# Patient Record
Sex: Male | Born: 1970 | Race: White | Hispanic: No | Marital: Single | Smoking: Former smoker
Health system: Southern US, Community
[De-identification: ages and names within clinical notes are randomized; demographics above are authoritative.]

## PROBLEM LIST (undated history)

## (undated) DIAGNOSIS — B009 Herpesviral infection, unspecified: Secondary | ICD-10-CM

## (undated) DIAGNOSIS — B159 Hepatitis A without hepatic coma: Secondary | ICD-10-CM

## (undated) DIAGNOSIS — E785 Hyperlipidemia, unspecified: Secondary | ICD-10-CM

## (undated) HISTORY — DX: Hepatitis a without hepatic coma: B15.9

## (undated) HISTORY — DX: Hyperlipidemia, unspecified: E78.5

## (undated) HISTORY — DX: Herpesviral infection, unspecified: B00.9

---

## 1997-07-09 HISTORY — PX: COCCYX REMOVAL: SHX600

## 2002-07-09 HISTORY — PX: BACK SURGERY: SHX140

## 2003-06-14 ENCOUNTER — Ambulatory Visit (HOSPITAL_COMMUNITY): Admission: RE | Admit: 2003-06-14 | Discharge: 2003-06-15 | Payer: Self-pay | Admitting: Specialist

## 2011-10-01 ENCOUNTER — Other Ambulatory Visit: Payer: Self-pay | Admitting: Family Medicine

## 2011-10-01 ENCOUNTER — Ambulatory Visit
Admission: RE | Admit: 2011-10-01 | Discharge: 2011-10-01 | Disposition: A | Discharge status: Home / Self Care | Payer: Managed Care, Other (non HMO) | Source: Ambulatory Visit | Attending: Family Medicine | Admitting: Family Medicine

## 2011-10-01 DIAGNOSIS — R109 Unspecified abdominal pain: Secondary | ICD-10-CM

## 2011-10-03 ENCOUNTER — Encounter (INDEPENDENT_AMBULATORY_CARE_PROVIDER_SITE_OTHER): Payer: Self-pay | Admitting: Surgery

## 2011-10-03 ENCOUNTER — Ambulatory Visit (INDEPENDENT_AMBULATORY_CARE_PROVIDER_SITE_OTHER): Payer: Managed Care, Other (non HMO) | Admitting: Surgery

## 2011-10-03 ENCOUNTER — Telehealth (INDEPENDENT_AMBULATORY_CARE_PROVIDER_SITE_OTHER): Payer: Self-pay | Admitting: Surgery

## 2011-10-03 ENCOUNTER — Encounter (HOSPITAL_COMMUNITY): Payer: Self-pay | Admitting: *Deleted

## 2011-10-03 VITALS — BP 118/66 | HR 68 | Temp 97.4°F | Resp 16 | Ht 72.0 in | Wt 192.2 lb

## 2011-10-03 DIAGNOSIS — R1011 Right upper quadrant pain: Secondary | ICD-10-CM

## 2011-10-03 DIAGNOSIS — K8010 Calculus of gallbladder with chronic cholecystitis without obstruction: Secondary | ICD-10-CM

## 2011-10-03 NOTE — Patient Instructions (Signed)
Gallbladder Disease  Gallbladder disease (cholecystitis) is an inflammation of your gallbladder. It is usually caused by a build-up of stones (gallstones) or sludge (cholelithiasis) in your gallbladder. The gallbladder is not an essential organ. It is located slightly to the right of center in the belly (abdomen), behind the liver. It stores bile made in the liver. Bile aids in digestion of fats. Gallbladder disease may result in nausea (feeling sick to your stomach), abdominal pain, and jaundice. In severe cases, emergency surgery may be required.  The most common type of gallbladder disease is gallstones. They begin as small crystals and slowly grow into stones. Gallstone pain occurs when the bile duct has spasms. The spasms are caused by the stone passing out of the duct. The stone is trying to pass at the same time bile is passing into the small bowel for digestion. The pain usually begins suddenly. It may persist from several minutes to several hours. Infection can occur. Infection can add to discomfort and severity of an acute attack. The pain may be made worse by breathing deeply or by being jarred. There may be fever and tenderness to the touch. In some cases, when gallstones do not move into the bile duct, people have no pain or symptoms. These are called "silent" gallstones.  Women are three times more likely to develop gallstones than men. Women who have had several pregnancies are more likely to have gallbladder disease. Physicians sometimes advise removing diseased gallbladders before future pregnancies. Other factors that increase the risk of gallbladder disease are obesity, diets heavy in fried foods and dairy products, increasing age, prolonged use of medications containing male hormones, and heredity.  HOME CARE INSTRUCTIONS    If your physician prescribed an antibiotic, take as directed.   Only take over-the-counter or prescription medicines for pain, discomfort, or fever as directed by your  caregiver.   Follow a low fat diet until seen again. (Fat causes the gallbladder to contract.)   Follow-up as instructed. Attacks are almost always recurrent and surgery is usually required for permanent treatment.  SEEK IMMEDIATE MEDICAL CARE IF:    Pain is increasing and not controlled by medications.   The pain moves to another part of your abdomen or to your back. (Right sided pain can be appendicitis and left sided pain in adults can be diverticulitis).   You have a fever.   You develop nausea and vomiting.  Document Released: 06/25/2005 Document Revised: 06/14/2011 Document Reviewed: 05/11/2011  Exit[employer] Patient Information 2012 [employer].

## 2011-10-03 NOTE — Progress Notes (Signed)
Chief Complaint  Patient presents with  . Abdominal Pain    Cholelithiasis, cholecystitis - referral from Dr. Darrow Bussing, [facility]    HISTORY: Patient is a 41 year old white male from [place], [place].  He presents with a two-year history of intermittent right upper quadrant abdominal pain. His most recent episode began 2 days ago when he had sudden onset of right upper quadrant abdominal pain. This has improved but is persistent. He was seen at the office of his primary care physician. Laboratory work from her earlier this month was within normal limits. Ultrasound of the abdomen was performed on October 01, 2011 and shows cholelithiasis with evidence of cholecystitis.  The patient denies fevers or chills. He denies nausea or vomiting. He denies jaundice or acholic stools. The pain radiates to the back. He has had no prior abdominal surgery.  Past Medical History  Diagnosis Date  . Hyperlipemia   . HSV (herpes simplex virus) infection   . Abdominal pain      Current Outpatient Prescriptions  Medication Sig Dispense Refill  . fenofibrate (TRICOR) 145 MG tablet Take 145 mg by mouth daily.      . niacin (NIASPAN) 500 MG CR tablet Take 500 mg by mouth at bedtime.      . Probiotic Product (RESTORA PO) Take by mouth.      . valACYclovir (VALTREX) 1000 MG tablet Take 1 g by mouth as needed.          No Known Allergies   Family History  Problem Relation Age of Onset  . Hyperlipidemia Mother   . Hypertension Father      History   Social History  . Marital Status: Single    Spouse Name: N/A    Number of Children: N/A  . Years of Education: N/A   Social History Main Topics  . Smoking status: Current Everyday Smoker -- 0.3 packs/day    Types: Cigarettes  . Smokeless tobacco: Never Used  . Alcohol Use: Yes     socially - 2 drinks per week  . Drug Use: No  . Sexually Active: None   Other Topics Concern  . None   Social History Narrative  . None     REVIEW  OF SYSTEMS - PERTINENT POSITIVES ONLY: Denies jaundice. Denies acholic stools. Denies fever. Denies chills. History of hepatitis A as a teenager.  EXAM: Filed Vitals:   10/03/11 0856  BP: 118/66  Pulse: 68  Temp: 97.4 F (36.3 C)  Resp: 16    HEENT: normocephalic; pupils equal and reactive; sclerae clear; dentition good; mucous membranes moist NECK:  symmetric on extension; no palpable anterior or posterior cervical lymphadenopathy; no supraclavicular masses; no tenderness CHEST: clear to auscultation bilaterally without rales, rhonchi, or wheezes CARDIAC: regular rate and rhythm without significant murmur; peripheral pulses are full ABDOMEN: soft without distension; bowel sounds present; no mass; no hepatosplenomegaly; no hernia;  Mild tenderness right upper quadrant without guarding or rebound. Suggestion of a soft mass possibly representing the fundus of the gallbladder EXT:  non-tender without edema; no deformity NEURO: no gross focal deficits; no sign of tremor   LABORATORY RESULTS: See [facility] (CHL-Epic) for most recent results   RADIOLOGY RESULTS: See [facility] (CHL-Epic) for most recent results   IMPRESSION: Symptomatic cholelithiasis, probable chronic cholecystitis  PLAN: I discussed the above findings at length with the patient. I provided him with written literature to review. I have recommended laparoscopic cholecystectomy with intraoperative cholangiography. We have discussed the possibility  of conversion to open surgery. We discussed the possibility of choledocholithiasis and need for ERCP. Patient understands and wishes to proceed with surgery in the near future. We have discussed Hospital course to be anticipated and his postoperative recovery and return to work. He understands.  The risks and benefits of the procedure have been discussed at length with the patient.  The patient understands the proposed procedure, potential alternative treatments, and  the course of recovery to be expected.  All of the patient's questions have been answered at this time.  The patient wishes to proceed with surgery.   Velora Heckler, MD, FACS General & Endocrine Surgery [facility]   Visit Diagnoses: 1. Cholelithiasis with cholecystitis   2. Abdominal pain, right upper quadrant     Primary Care Physician: Darrow Bussing, MD, MD

## 2011-10-09 ENCOUNTER — Encounter (HOSPITAL_COMMUNITY): Payer: Self-pay | Admitting: Pharmacy Technician

## 2011-10-18 ENCOUNTER — Encounter (HOSPITAL_COMMUNITY): Admission: RE | Payer: Self-pay | Source: Ambulatory Visit

## 2011-10-18 ENCOUNTER — Ambulatory Visit (HOSPITAL_COMMUNITY): Admission: RE | Admit: 2011-10-18 | Payer: Managed Care, Other (non HMO) | Source: Ambulatory Visit | Admitting: Surgery

## 2011-10-18 SURGERY — LAPAROSCOPIC CHOLECYSTECTOMY WITH INTRAOPERATIVE CHOLANGIOGRAM
Anesthesia: General

## 2011-10-29 ENCOUNTER — Encounter (INDEPENDENT_AMBULATORY_CARE_PROVIDER_SITE_OTHER): Payer: Managed Care, Other (non HMO) | Admitting: Surgery

## 2013-03-10 ENCOUNTER — Encounter (INDEPENDENT_AMBULATORY_CARE_PROVIDER_SITE_OTHER): Payer: Self-pay | Admitting: Surgery

## 2013-03-10 ENCOUNTER — Encounter (INDEPENDENT_AMBULATORY_CARE_PROVIDER_SITE_OTHER): Payer: Self-pay

## 2013-03-10 ENCOUNTER — Ambulatory Visit (INDEPENDENT_AMBULATORY_CARE_PROVIDER_SITE_OTHER): Payer: 59 | Admitting: Surgery

## 2013-03-10 ENCOUNTER — Telehealth (INDEPENDENT_AMBULATORY_CARE_PROVIDER_SITE_OTHER): Payer: Self-pay

## 2013-03-10 VITALS — BP 116/76 | HR 77 | Temp 97.7°F | Resp 16 | Ht 72.0 in | Wt 194.8 lb

## 2013-03-10 DIAGNOSIS — K8010 Calculus of gallbladder with chronic cholecystitis without obstruction: Secondary | ICD-10-CM

## 2013-03-10 DIAGNOSIS — R1011 Right upper quadrant pain: Secondary | ICD-10-CM

## 2013-03-10 NOTE — Telephone Encounter (Signed)
Letter printed and mailed to pts address per request.

## 2013-03-10 NOTE — Patient Instructions (Signed)
  [facility]  LAPAROSCOPIC SURGERY - POST-OP INSTRUCTIONS  Always review your discharge instruction sheet given to you by the facility where your surgery was performed.  A prescription for pain medication may be given to you upon discharge.  Take your pain medication as prescribed.  If narcotic pain medicine is not needed, then you may take acetaminophen (Tylenol) or ibuprofen (Advil) as needed.  Take your usually prescribed medications unless otherwise directed.  If you need a refill on your pain medication, please contact your pharmacy.  They will contact our office to request authorization. Prescriptions will not be filled after 5 P.M. or on weekends.  You should follow a light diet the first few days after arrival home, such as soup and crackers or toast.  Be sure to include plenty of fluids daily.  Most patients will experience some swelling and bruising in the area of the incisions.  Ice packs will help.  Swelling and bruising can take several days to resolve.   It is common to experience some constipation if taking pain medication after surgery.  Increasing fluid intake and taking a stool softener (such as Colace) will usually help or prevent this problem from occurring.  A mild laxative (Milk of Magnesia or Miralax) should be taken according to package instructions if there are no bowel movements after 48 hours.  Unless discharge instructions indicate otherwise, you may remove your bandages 24-48 hours after surgery, and you may shower at that time.  You may have steri-strips (small skin tapes) in place directly over the incision.  These strips should be left on the skin for 7-10 days.  If your surgeon used skin glue on the incision, you may shower in 24 hours.  The glue will flake off over the next 2-3 weeks.  Any sutures or staples will be removed at the office during your follow-up visit.  ACTIVITIES:  You may resume regular (light) daily activities beginning the  next day-such as daily self-care, walking, climbing stairs-gradually increasing activities as tolerated.  You may have sexual intercourse when it is comfortable.  Refrain from any heavy lifting or straining until approved by your doctor.  You may drive when you are no longer taking prescription pain medication, you can comfortably wear a seatbelt, and you can safely maneuver your car and apply brakes.  You should see your doctor in the office for a follow-up appointment approximately 2-3 weeks after your surgery.  Make sure that you call for this appointment within a day or two after you arrive home to insure a convenient appointment time.  WHEN TO CALL YOUR DOCTOR: 1. Fever over 101.0 2. Inability to urinate 3. Continued bleeding from incision 4. Increased pain, redness, or drainage from the incision 5. Increasing abdominal pain  The clinic staff is available to answer your questions during regular business hours.  Please don't hesitate to call and ask to speak to one of the nurses for clinical concerns.  If you have a medical emergency, go to the nearest emergency room or call 911.  A surgeon from [facility] is always on call for the hospital.  Luella Gardenhire M. Fran Neiswonger, MD, FACS [facility] Office: [phone] Toll Free:  [phone] FAX [phone]  Web site: [email or web address] 

## 2013-03-10 NOTE — Telephone Encounter (Signed)
Message copied by Joanette Gula on Tue Mar 10, 2013  1:58 PM ------      Message from: Leanne Chang      Created: Tue Mar 10, 2013 10:21 AM      Regarding: Dr Joanie Coddington: [phone]       Need letter stating he's having sx sent to his girlfriend in [place] so she will be allowed to come take care of him. May be mailed to patient. ------

## 2013-03-10 NOTE — Progress Notes (Signed)
General Surgery [facility]  Chief Complaint  Patient presents with  . Follow-up    cholelithiasis - symptomatic - primary care is Dr. Carilyn Goodpasture Koirala    HISTORY: Patient is a 42 year old male evaluated in March of 2013 with symptomatic cholelithiasis. Patient has postponed surgery and has tried homeopathic remedies with limited success. He had an episode of severe epigastric abdominal pain associated with nausea and vomiting lasting for approximately one day which occurred one month ago. He now presents to consider scheduling surgery for cholecystectomy.  PERTINENT REVIEW OF SYSTEMS: Denies jaundice. Denies acholic stools. Denies fever.  EXAM: HEENT: normocephalic; pupils equal and reactive; sclerae clear; dentition good; mucous membranes moist NECK:  symmetric on extension; no palpable anterior or posterior cervical lymphadenopathy; no supraclavicular masses; no tenderness CHEST: clear to auscultation bilaterally without rales, rhonchi, or wheezes CARDIAC: regular rate and rhythm without significant murmur; peripheral pulses are full ABDOMEN: Soft without distention; no incisions; no hernia; no palpable masses EXT:  non-tender without edema; no deformity NEURO: no gross focal deficits; no sign of tremor   IMPRESSION: Symptomatic cholelithiasis  PLAN: I again reviewed with the patient the procedure of laparoscopic cholecystectomy. We discussed alternative therapies. We discussed possible complications. He understands and wishes to proceed with laparoscopic cholecystectomy with intraoperative cholangiography in the near future.  The risks and benefits of the procedure have been discussed at length with the patient.  The patient understands the proposed procedure, potential alternative treatments, and the course of recovery to be expected.  All of the patient's questions have been answered at this time.  The patient wishes to proceed with surgery.  Velora Heckler,  MD, [facility] Office: [phone]  Visit Diagnoses: 1. Cholelithiasis with cholecystitis   2. Abdominal pain, right upper quadrant

## 2013-03-27 ENCOUNTER — Encounter (INDEPENDENT_AMBULATORY_CARE_PROVIDER_SITE_OTHER): Payer: Self-pay | Admitting: *Deleted

## 2013-04-01 ENCOUNTER — Telehealth (INDEPENDENT_AMBULATORY_CARE_PROVIDER_SITE_OTHER): Payer: Self-pay

## 2013-04-01 ENCOUNTER — Encounter (INDEPENDENT_AMBULATORY_CARE_PROVIDER_SITE_OTHER): Payer: Self-pay

## 2013-04-01 ENCOUNTER — Encounter (INDEPENDENT_AMBULATORY_CARE_PROVIDER_SITE_OTHER): Payer: Self-pay | Admitting: General Surgery

## 2013-04-01 ENCOUNTER — Telehealth (INDEPENDENT_AMBULATORY_CARE_PROVIDER_SITE_OTHER): Payer: Self-pay | Admitting: Surgery

## 2013-04-01 NOTE — Telephone Encounter (Signed)
Patient date of surgery has changed he will need a new  letter stating he's having sx sent to his girlfriend in [place] so she will be allowed to come take care of him. May be mailed to patient.

## 2013-04-01 NOTE — Telephone Encounter (Signed)
Pt advised of recovery expectations. Pt states he understands. Letter for caregivers visa faxed to [phone] and mailed to pt at home address.

## 2013-04-28 ENCOUNTER — Encounter (HOSPITAL_COMMUNITY): Payer: Self-pay | Admitting: Pharmacy Technician

## 2013-04-30 ENCOUNTER — Other Ambulatory Visit (HOSPITAL_COMMUNITY): Payer: Self-pay | Admitting: *Deleted

## 2013-04-30 NOTE — Patient Instructions (Addendum)
20      Your procedure is scheduled on:  Tuesday 05/05/2013  Report to [facility] at  0530 AM.  Call this number if you have problems the night before or morning of surgery: [phone]   Remember:   Do not eat food or drink liquids AFTER MIDNIGHT!   Do not bring valuables to the hospital. [facility] IS NOT RESPONSIBLE  FOR ANY BELONGINGS OR VALUABLES BROUGHT TO HOSPITAL.  Leave suitcase in the car. After surgery it may be brought to your room.  For patients admitted to the hospital, checkout time is 11:00 AM the day of              Discharge.    DO NOT WEAR JEWELRY , MAKE-UP, LOTIONS,POWDERS,PERFUMES!             WOMEN -DO NOT SHAVE LEGS OR UNDERARMS 12 HRS. BEFORE  SURGERY!               MEN MAY SHAVE AS USUAL!             CONTACTS,DENTURES OR BRIDGEWORK, FALSE EYELASHES MAY  NOT BE WORN INTO SURGERY!                                 Special Instructions:             Please read over the following fact sheets that you were given:             1. [facility] PREPARING FOR SURGERY SHEET                              Steve Francis     [phone]                FAILURE TO FOLLOW THESE INSTRUCTIONS MAY RESULT IN   CANCELLATION OF YOUR SURGERY!               Patient Signature:___________________________

## 2013-05-01 ENCOUNTER — Encounter (HOSPITAL_COMMUNITY): Payer: Self-pay

## 2013-05-01 ENCOUNTER — Encounter (INDEPENDENT_AMBULATORY_CARE_PROVIDER_SITE_OTHER): Payer: Self-pay

## 2013-05-01 ENCOUNTER — Encounter (HOSPITAL_COMMUNITY)
Admission: RE | Admit: 2013-05-01 | Discharge: 2013-05-01 | Disposition: A | Discharge status: Home / Self Care | Payer: 59 | Source: Ambulatory Visit | Attending: Surgery | Admitting: Surgery

## 2013-05-01 DIAGNOSIS — Z01812 Encounter for preprocedural laboratory examination: Secondary | ICD-10-CM | POA: Insufficient documentation

## 2013-05-01 DIAGNOSIS — Z01818 Encounter for other preprocedural examination: Secondary | ICD-10-CM | POA: Insufficient documentation

## 2013-05-01 LAB — COMPREHENSIVE METABOLIC PANEL
ALT: 21 U/L (ref 0–53)
AST: 25 U/L (ref 0–37)
Albumin: 4.1 g/dL (ref 3.5–5.2)
Alkaline Phosphatase: 52 U/L (ref 39–117)
BUN: 17 mg/dL (ref 6–23)
CO2: 25 mEq/L (ref 19–32)
Calcium: 9.6 mg/dL (ref 8.4–10.5)
Chloride: 102 mEq/L (ref 96–112)
Creatinine, Ser: 0.88 mg/dL (ref 0.50–1.35)
GFR calc Af Amer: >90 mL/min (ref >90)
GFR calc non Af Amer: >90 mL/min (ref >90)
Glucose, Bld: 95 mg/dL (ref 70–99)
Potassium: 4.7 mEq/L (ref 3.5–5.1)
Sodium: 137 mEq/L (ref 135–145)
Total Bilirubin: 0.4 mg/dL (ref 0.3–1.2)
Total Protein: 7.4 g/dL (ref 6.0–8.3)

## 2013-05-01 LAB — CBC
HCT: 45.7 % (ref 39.0–52.0)
Hemoglobin: 15.3 g/dL (ref 13.0–17.0)
MCH: 29.1 pg (ref 26.0–34.0)
MCHC: 33.5 g/dL (ref 30.0–36.0)
MCV: 86.9 fL (ref 78.0–100.0)
Platelets: 190 10*3/uL (ref 150–400)
RBC: 5.26 MIL/uL (ref 4.22–5.81)
RDW: 13.9 % (ref 11.5–15.5)
WBC: 7.9 10*3/uL (ref 4.0–10.5)

## 2013-05-01 NOTE — Progress Notes (Signed)
Quick Note:  These results are acceptable for scheduled surgery.  Rashun Grattan M. Garon Melander, MD, FACS [facility] Office: [phone]   ______ 

## 2013-05-01 NOTE — Progress Notes (Signed)
Quick Note:  These results are acceptable for scheduled surgery.  Emme Rosenau M. Danetra Glock, MD, FACS [facility] Office: [phone]   ______ 

## 2013-05-01 NOTE — Progress Notes (Signed)
Quick Note:  These results are acceptable for scheduled surgery.  Madden Piazza M. Nuriya Stuck, MD, FACS [facility] Office: [phone]   ______ 

## 2013-05-05 ENCOUNTER — Telehealth (INDEPENDENT_AMBULATORY_CARE_PROVIDER_SITE_OTHER): Payer: Self-pay | Admitting: *Deleted

## 2013-05-05 ENCOUNTER — Encounter (HOSPITAL_COMMUNITY)
Admission: RE | Disposition: A | Discharge status: Home / Self Care | Payer: Self-pay | Source: Ambulatory Visit | Attending: Surgery

## 2013-05-05 ENCOUNTER — Observation Stay (HOSPITAL_COMMUNITY)
Admission: RE | Admit: 2013-05-05 | Discharge: 2013-05-05 | Disposition: A | Discharge status: Home / Self Care | Payer: 59 | Source: Ambulatory Visit | Attending: Surgery | Admitting: Surgery

## 2013-05-05 ENCOUNTER — Encounter (HOSPITAL_COMMUNITY): Payer: 59 | Admitting: Anesthesiology

## 2013-05-05 ENCOUNTER — Ambulatory Visit (HOSPITAL_COMMUNITY): Payer: 59

## 2013-05-05 ENCOUNTER — Encounter (HOSPITAL_COMMUNITY): Payer: Self-pay | Admitting: *Deleted

## 2013-05-05 ENCOUNTER — Ambulatory Visit (HOSPITAL_COMMUNITY): Payer: 59 | Admitting: Anesthesiology

## 2013-05-05 DIAGNOSIS — K8010 Calculus of gallbladder with chronic cholecystitis without obstruction: Principal | ICD-10-CM | POA: Insufficient documentation

## 2013-05-05 DIAGNOSIS — R1011 Right upper quadrant pain: Secondary | ICD-10-CM | POA: Diagnosis present

## 2013-05-05 HISTORY — PX: CHOLECYSTECTOMY: SHX55

## 2013-05-05 SURGERY — LAPAROSCOPIC CHOLECYSTECTOMY WITH INTRAOPERATIVE CHOLANGIOGRAM
Anesthesia: General | Wound class: Clean Contaminated

## 2013-05-05 MED ORDER — ONDANSETRON HCL 4 MG PO TABS: 4.0000 mg | ORAL_TABLET | Freq: Four times a day (QID) | ORAL | Status: DC | PRN

## 2013-05-05 MED ORDER — CEFAZOLIN SODIUM-DEXTROSE 2-3 GM-% IV SOLR
2.0000 g | INTRAVENOUS | Status: AC
  Administered 2013-05-05: 2 g via INTRAVENOUS

## 2013-05-05 MED ORDER — MEPERIDINE HCL 50 MG/ML IJ SOLN
6.2500 mg | INTRAMUSCULAR | Status: DC | PRN
  Administered 2013-05-05: 6.25 mg via INTRAVENOUS

## 2013-05-05 MED ORDER — GLYCOPYRROLATE 0.2 MG/ML IJ SOLN
INTRAMUSCULAR | Status: DC | PRN
  Administered 2013-05-05: 0.6 mg via INTRAVENOUS

## 2013-05-05 MED ORDER — BUPIVACAINE-EPINEPHRINE 0.5% -1:200000 IJ SOLN
INTRAMUSCULAR | Status: AC
  Filled 2013-05-05: qty 1

## 2013-05-05 MED ORDER — KCL IN DEXTROSE-NACL 20-5-0.45 MEQ/L-%-% IV SOLN
INTRAVENOUS | Status: DC
  Administered 2013-05-05: 12:00:00 via INTRAVENOUS
  Filled 2013-05-05: qty 1000

## 2013-05-05 MED ORDER — OXYCODONE-ACETAMINOPHEN 5-325 MG PO TABS
1.0000 | ORAL_TABLET | ORAL | Status: DC | PRN
  Administered 2013-05-05: 2 via ORAL
  Filled 2013-05-05: qty 2

## 2013-05-05 MED ORDER — DEXAMETHASONE SODIUM PHOSPHATE 10 MG/ML IJ SOLN
INTRAMUSCULAR | Status: DC | PRN
  Administered 2013-05-05: 10 mg via INTRAVENOUS

## 2013-05-05 MED ORDER — LACTATED RINGERS IV SOLN: INTRAVENOUS | Status: DC

## 2013-05-05 MED ORDER — ONDANSETRON HCL 4 MG/2ML IJ SOLN
INTRAMUSCULAR | Status: DC | PRN
  Administered 2013-05-05: 4 mg via INTRAVENOUS

## 2013-05-05 MED ORDER — OXYCODONE HCL ER 20 MG PO T12A: 20.0000 mg | EXTENDED_RELEASE_TABLET | Freq: Two times a day (BID) | ORAL | Status: DC

## 2013-05-05 MED ORDER — MIDAZOLAM HCL 5 MG/5ML IJ SOLN
INTRAMUSCULAR | Status: DC | PRN
  Administered 2013-05-05: 2 mg via INTRAVENOUS

## 2013-05-05 MED ORDER — ONDANSETRON HCL 4 MG/2ML IJ SOLN: 4.0000 mg | Freq: Four times a day (QID) | INTRAMUSCULAR | Status: DC | PRN

## 2013-05-05 MED ORDER — NEOSTIGMINE METHYLSULFATE 1 MG/ML IJ SOLN
INTRAMUSCULAR | Status: DC | PRN
  Administered 2013-05-05: 4 mg via INTRAVENOUS

## 2013-05-05 MED ORDER — OXYCODONE HCL ER 20 MG PO T12A
20.0000 mg | EXTENDED_RELEASE_TABLET | Freq: Two times a day (BID) | ORAL | Status: DC
  Filled 2013-05-05: qty 1

## 2013-05-05 MED ORDER — PROMETHAZINE HCL 25 MG/ML IJ SOLN: 6.2500 mg | INTRAMUSCULAR | Status: DC | PRN

## 2013-05-05 MED ORDER — PROPOFOL 10 MG/ML IV BOLUS
INTRAVENOUS | Status: DC | PRN
  Administered 2013-05-05: 180 mg via INTRAVENOUS

## 2013-05-05 MED ORDER — CEFAZOLIN SODIUM-DEXTROSE 2-3 GM-% IV SOLR
INTRAVENOUS | Status: AC
  Filled 2013-05-05: qty 50

## 2013-05-05 MED ORDER — HYDROMORPHONE HCL PF 1 MG/ML IJ SOLN
0.2500 mg | INTRAMUSCULAR | Status: DC | PRN
  Administered 2013-05-05: 0.5 mg via INTRAVENOUS

## 2013-05-05 MED ORDER — 0.9 % SODIUM CHLORIDE (POUR BTL) OPTIME
TOPICAL | Status: DC | PRN
  Administered 2013-05-05: 1000 mL

## 2013-05-05 MED ORDER — SUCCINYLCHOLINE CHLORIDE 20 MG/ML IJ SOLN
INTRAMUSCULAR | Status: DC | PRN
  Administered 2013-05-05: 100 mg via INTRAVENOUS

## 2013-05-05 MED ORDER — LACTATED RINGERS IV SOLN
INTRAVENOUS | Status: DC | PRN
  Administered 2013-05-05 (×2): via INTRAVENOUS

## 2013-05-05 MED ORDER — MEPERIDINE HCL 50 MG/ML IJ SOLN
INTRAMUSCULAR | Status: AC
  Filled 2013-05-05: qty 1

## 2013-05-05 MED ORDER — HYDROMORPHONE HCL PF 1 MG/ML IJ SOLN
INTRAMUSCULAR | Status: AC
  Filled 2013-05-05: qty 1

## 2013-05-05 MED ORDER — OXYCODONE-ACETAMINOPHEN 5-325 MG PO TABS: 1.0000 | ORAL_TABLET | ORAL | Status: DC | PRN

## 2013-05-05 MED ORDER — HYDROMORPHONE HCL PF 1 MG/ML IJ SOLN: 1.0000 mg | INTRAMUSCULAR | Status: DC | PRN

## 2013-05-05 MED ORDER — BUPIVACAINE-EPINEPHRINE 0.5% -1:200000 IJ SOLN
INTRAMUSCULAR | Status: DC | PRN
  Administered 2013-05-05: 20 mL

## 2013-05-05 MED ORDER — IOHEXOL 300 MG/ML  SOLN
INTRAMUSCULAR | Status: DC | PRN
  Administered 2013-05-05: 50 mL via INTRAVENOUS

## 2013-05-05 MED ORDER — ACETAMINOPHEN 325 MG PO TABS: 650.0000 mg | ORAL_TABLET | ORAL | Status: DC | PRN

## 2013-05-05 MED ORDER — ROCURONIUM BROMIDE 100 MG/10ML IV SOLN
INTRAVENOUS | Status: DC | PRN
  Administered 2013-05-05: 30 mg via INTRAVENOUS
  Administered 2013-05-05 (×2): 10 mg via INTRAVENOUS

## 2013-05-05 MED ORDER — FENTANYL CITRATE 0.05 MG/ML IJ SOLN
INTRAMUSCULAR | Status: DC | PRN
  Administered 2013-05-05 (×2): 50 ug via INTRAVENOUS
  Administered 2013-05-05: 100 ug via INTRAVENOUS
  Administered 2013-05-05 (×2): 50 ug via INTRAVENOUS

## 2013-05-05 MED ORDER — LACTATED RINGERS IV SOLN
INTRAVENOUS | Status: DC | PRN
  Administered 2013-05-05: 1000 mL via INTRAVENOUS

## 2013-05-05 MED ORDER — LIDOCAINE HCL (CARDIAC) 20 MG/ML IV SOLN
INTRAVENOUS | Status: DC | PRN
  Administered 2013-05-05: 60 mg via INTRAVENOUS

## 2013-05-05 SURGICAL SUPPLY — 36 items
APPLIER CLIP ROT 10 11.4 M/L (STAPLE) ×2
BENZOIN TINCTURE PRP APPL 2/3 (GAUZE/BANDAGES/DRESSINGS) ×2 IMPLANT
CABLE HIGH FREQUENCY MONO STRZ (ELECTRODE) ×2 IMPLANT
CANISTER SUCTION 2500CC (MISCELLANEOUS) ×2 IMPLANT
CHLORAPREP W/TINT 26ML (MISCELLANEOUS) ×2 IMPLANT
CLIP APPLIE ROT 10 11.4 M/L (STAPLE) ×1 IMPLANT
COVER MAYO STAND STRL (DRAPES) ×2 IMPLANT
DECANTER SPIKE VIAL GLASS SM (MISCELLANEOUS) ×2 IMPLANT
DRAPE C-ARM 42X120 X-RAY (DRAPES) ×2 IMPLANT
DRAPE LAPAROSCOPIC ABDOMINAL (DRAPES) ×2 IMPLANT
DRAPE UTILITY XL STRL (DRAPES) ×2 IMPLANT
ELECT REM PT RETURN 9FT ADLT (ELECTROSURGICAL) ×2
ELECTRODE REM PT RTRN 9FT ADLT (ELECTROSURGICAL) ×1 IMPLANT
GAUZE SPONGE 2X2 8PLY STRL LF (GAUZE/BANDAGES/DRESSINGS) ×1 IMPLANT
GLOVE SURG ORTHO 8.0 STRL STRW (GLOVE) ×2 IMPLANT
GOWN STRL REIN XL XLG (GOWN DISPOSABLE) ×6 IMPLANT
HEMOSTAT SURGICEL 4X8 (HEMOSTASIS) IMPLANT
KIT BASIN OR (CUSTOM PROCEDURE TRAY) ×2 IMPLANT
NS IRRIG 1000ML POUR BTL (IV SOLUTION) ×2 IMPLANT
POUCH SPECIMEN RETRIEVAL 10MM (ENDOMECHANICALS) ×2 IMPLANT
SCISSORS LAP 5X35 DISP (ENDOMECHANICALS) ×2 IMPLANT
SET CHOLANGIOGRAPH MIX (MISCELLANEOUS) ×2 IMPLANT
SET IRRIG TUBING LAPAROSCOPIC (IRRIGATION / IRRIGATOR) ×2 IMPLANT
SLEEVE XCEL OPT CAN 5 100 (ENDOMECHANICALS) ×2 IMPLANT
SOLUTION ANTI FOG 6CC (MISCELLANEOUS) ×2 IMPLANT
SPONGE GAUZE 2X2 STER 10/PKG (GAUZE/BANDAGES/DRESSINGS) ×1
STRIP CLOSURE SKIN 1/2X4 (GAUZE/BANDAGES/DRESSINGS) ×2 IMPLANT
SUT MNCRL AB 4-0 PS2 18 (SUTURE) ×2 IMPLANT
TAPE CLOTH SURG 4X10 WHT LF (GAUZE/BANDAGES/DRESSINGS) ×2 IMPLANT
TOWEL OR 17X26 10 PK STRL BLUE (TOWEL DISPOSABLE) ×2 IMPLANT
TOWEL OR NON WOVEN STRL DISP B (DISPOSABLE) ×2 IMPLANT
TRAY LAP CHOLE (CUSTOM PROCEDURE TRAY) ×2 IMPLANT
TROCAR BLADELESS OPT 5 100 (ENDOMECHANICALS) ×2 IMPLANT
TROCAR XCEL BLUNT TIP 100MML (ENDOMECHANICALS) ×2 IMPLANT
TROCAR XCEL NON-BLD 11X100MML (ENDOMECHANICALS) ×2 IMPLANT
TUBING INSUFFLATION 10FT LAP (TUBING) ×2 IMPLANT

## 2013-05-05 NOTE — Telephone Encounter (Signed)
Pt called back demanding someone from our office come see him because he is in pain. He states he will not leave the hospital until he speaks to someone. I asked him what exactly he wanted since he was given pain medicine and he said he wasn't given what Dr Gerrit Friends and him agreed upon. I asked if he addressed this issue with the nurses and staff at the hospital and he states they told him they could not do anything for him. I told patient I would page our PA that was at the hospital and see if I could get someone over to talk to him. Paged Tresa Endo and she said she would stop by his room and address this issue.

## 2013-05-05 NOTE — H&P (Signed)
Steve Francis is an 42 y.o. male.    General Surgery [facility]  Chief Complaint:  Abdominal pain, symptomatic cholelithiasis  HPI: Patient is a 42 year old male evaluated in March of 2013 with symptomatic cholelithiasis. Patient has postponed surgery and has tried homeopathic remedies with limited success. He had an episode of severe epigastric abdominal pain associated with nausea and vomiting lasting for approximately one day which occurred one month ago. He now presents for cholecystectomy.   Past Medical History  Diagnosis Date  . Hyperlipemia     "on track now"  . HSV (herpes simplex virus) infection   . Abdominal pain   . Hepatitis A age 73    childhood    Past Surgical History  Procedure Laterality Date  . Coccyx removal  1999    broke it so removed part of it  . Back surgery  2004    lower s 4    Family History  Problem Relation Age of Onset  . Hyperlipidemia Mother   . Hypertension Father    Social History:  reports that he quit smoking about 13 months ago. His smoking use included Cigarettes. He has a 7.2 pack-year smoking history. He has never used smokeless tobacco. He reports that he drinks alcohol. He reports that he does not use illicit drugs.  Allergies:  Allergies  Allergen Reactions  . Cinnamon Anaphylaxis    Throat swells    Medications Prior to Admission  Medication Sig Dispense Refill  . fenofibrate (TRICOR) 145 MG tablet Take 145 mg by mouth every morning.       . valACYclovir (VALTREX) 1000 MG tablet Take 1 g by mouth daily as needed. Outbreak        No results found for this or any previous visit (from the past 48 hour(s)). No results found.  Review of Systems  Gastrointestinal: Positive for nausea and abdominal pain.  All other systems reviewed and are negative.    There were no vitals taken for this visit. Physical Exam  Constitutional: He is oriented to person, place, and time. He appears well-developed and  well-nourished. No distress.  HENT:  Head: Normocephalic and atraumatic.  Right Ear: External ear normal.  Left Ear: External ear normal.  Eyes: Conjunctivae are normal. Pupils are equal, round, and reactive to light. No scleral icterus.  Neck: Normal range of motion. Neck supple. No thyromegaly present.  Cardiovascular: Normal rate, regular rhythm and normal heart sounds.   No murmur heard. Respiratory: Effort normal and breath sounds normal. He has no wheezes.  GI: Bowel sounds are normal. He exhibits no distension and no mass. There is no tenderness. There is no rebound and no guarding.  Musculoskeletal: Normal range of motion. He exhibits no edema.  Lymphadenopathy:    He has no cervical adenopathy.  Neurological: He is alert and oriented to person, place, and time.  Skin: Skin is warm and dry.  Psychiatric: He has a normal mood and affect. His behavior is normal.     Assessment/Plan Symptomatic cholelithiasis  Plan lap chole with IOC  The risks and benefits of the procedure have been discussed at length with the patient.  The patient understands the proposed procedure, potential alternative treatments, and the course of recovery to be expected.  All of the patient's questions have been answered at this time.  The patient wishes to proceed with surgery.  Velora Heckler, MD, [facility] Office: [phone]    Bridgette Wolden Judie Petit 05/05/2013,  7:41 AM

## 2013-05-05 NOTE — Progress Notes (Signed)
Patient c/o post op pain but refused dilaudid because it makes him sleepy.preferred to get 2 percocets instead, 45 min after taking percocet patient claims that percocet did not help his pain,Dr Gerrit Friends was paged and few minutes later MD came to round on patient,Md and patient discussed about pain med and RN also at bedside.MD prescribed oxycontin 20 mgs q 12 hrs ,percocet 1 to 2 tabs q 6 hrs prn.Explained to patient about pain med orders, patient got angry and claims that is not what he wants, -insisting on oxycodone 20 mgs IR because that was what he was getting when he had back sx  Years ago." I was taking a purple pill and i cut it into four to control my pain."Patient called MD office himself and demanded he gets the prescription he wants.Also wants to speak to the Pharmacist, Brayton Caves came to talk to him.Patient called MD office again, and office paged [facility] to come see patient.Tresa Endo came and talked to patient, patient finally agreed on the prescriptions, and got ready for discharge.Discharge instructions given and patient verbalized understanding of instructions given.Dressing on umbilical area has serosanguinous drainage about 2 in diam., dressing changed prior to discharge.Patient tolerated regular diet prior to d/c and voiding well, denies nausea.

## 2013-05-05 NOTE — Telephone Encounter (Signed)
Patient called with complaint that the prescription he is being given it not what he and Dr. Gerrit Friends discussed.  Patient states that the Oxycodone prescription was suppose to be in a pill that he can cut in half.  Explained to patient that if he feels he needs 10mg  of Oxycodone then just take 2 of the Percocet which would be 10mg  of the Oxycodone however patient states that Percocet does not work for him.  Tried to explain to patient that Percocet is Oxycodone it just has Tylenol added however patient states it doesn't work for him.  Explained that I will send a message to Dr. Gerrit Friends to make him aware and see what his opinion is however it will be tomorrow before I will have an answer.  Patient states understanding that I will send a message to Dr. Gerrit Friends to ask then we will let him know.

## 2013-05-05 NOTE — Anesthesia Preprocedure Evaluation (Signed)
Anesthesia Evaluation  Patient identified by MRN, date of birth, ID band Patient awake    Reviewed: Allergy & Precautions, H&P , NPO status , Patient's Chart, lab work & pertinent test results  Airway Mallampati: II TM Distance: >3 FB Neck ROM: Full    Dental  (+) Teeth Intact and Dental Advisory Given   Pulmonary neg pulmonary ROS, Current Smoker,  breath sounds clear to auscultation  Pulmonary exam normal       Cardiovascular negative cardio ROS  Rhythm:Regular Rate:Normal     Neuro/Psych negative neurological ROS  negative psych ROS   GI/Hepatic negative GI ROS, Neg liver ROS,   Endo/Other  negative endocrine ROS  Renal/GU negative Renal ROS  negative genitourinary   Musculoskeletal negative musculoskeletal ROS (+)   Abdominal   Peds  Hematology negative hematology ROS (+)   Anesthesia Other Findings   Reproductive/Obstetrics                           Anesthesia Physical Anesthesia Plan  ASA: I  Anesthesia Plan: General   Post-op Pain Management:    Induction: Intravenous  Airway Management Planned: Oral ETT  Additional Equipment:   Intra-op Plan:   Post-operative Plan: Extubation in OR  Informed Consent: I have reviewed the patients History and Physical, chart, labs and discussed the procedure including the risks, benefits and alternatives for the proposed anesthesia with the patient or authorized representative who has indicated his/her understanding and acceptance.   Dental advisory given  Plan Discussed with: CRNA  Anesthesia Plan Comments:         Anesthesia Quick Evaluation

## 2013-05-05 NOTE — Discharge Summary (Signed)
Physician Discharge Summary [facility]  Patient ID: Steve Francis MRN: [id number] DOB/AGE: [id number] y.o.  Admit date: 05/05/2013 Discharge date: 05/05/2013  Admission Diagnoses:  Chronic cholecystitis, cholelithiasis, abdominal pain  Discharge Diagnoses:  Principal Problem:   Cholelithiasis with cholecystitis Active Problems:   Abdominal pain, right upper quadrant   Discharged Condition: good  Hospital Course: patient admitted for observation after lap chole with IOC.  Post op was able to ambulate, voided, and tolerated diet.  Prepared for discharged and requested discharge home on day of surgery.  Consults: None  Significant Diagnostic Studies: cholangiography, intraop  Treatments: surgery: lap cholecystectomy with IOC  Discharge Exam: Blood pressure 129/71, pulse 71, temperature 97.9 F (36.6 C), resp. rate 16, height 6' (1.829 m), weight 194 lb (87.998 kg), SpO2 98.00%. HEENT - clear Neck - soft Chest - clear bilaterally Cor - RRR Abd - soft without distension; dressings dry and intact  Disposition: Home with family  Discharge Orders   Future Appointments Provider Department Dept Phone   05/20/2013 9:30 AM Velora Heckler, MD [facility], [place] [phone]   Future Orders Complete By Expires   Diet - low sodium heart healthy  As directed    Discharge instructions  As directed    Comments:     [facility]  LAPAROSCOPIC SURGERY - POST-OP INSTRUCTIONS  Always review your discharge instruction sheet given to you by the facility where your surgery was performed.  A prescription for pain medication may be given to you upon discharge.  Take your pain medication as prescribed.  If narcotic pain medicine is not needed, then you may take acetaminophen (Tylenol) or ibuprofen (Advil) as needed.  Take your usually prescribed medications unless otherwise directed.  If you need a refill on your pain medication,  please contact your pharmacy.  They will contact our office to request authorization. Prescriptions will not be filled after 5 P.M. or on weekends.  You should follow a light diet the first few days after arrival home, such as soup and crackers or toast.  Be sure to include plenty of fluids daily.  Most patients will experience some swelling and bruising in the area of the incisions.  Ice packs will help.  Swelling and bruising can take several days to resolve.   It is common to experience some constipation if taking pain medication after surgery.  Increasing fluid intake and taking a stool softener (such as Colace) will usually help or prevent this problem from occurring.  A mild laxative (Milk of Magnesia or Miralax) should be taken according to package instructions if there are no bowel movements after 48 hours.  Unless discharge instructions indicate otherwise, you may remove your bandages 24-48 hours after surgery, and you may shower at that time.  You may have steri-strips (small skin tapes) in place directly over the incision.  These strips should be left on the skin for 7-10 days.  If your surgeon used skin glue on the incision, you may shower in 24 hours.  The glue will flake off over the next 2-3 weeks.  Any sutures or staples will be removed at the office during your follow-up visit.  ACTIVITIES:  You may resume regular (light) daily activities beginning the next day-such as daily self-care, walking, climbing stairs-gradually increasing activities as tolerated.  You may have sexual intercourse when it is comfortable.  Refrain from any heavy lifting or straining until approved by your doctor.  You may drive when you  are no longer taking prescription pain medication, you can comfortably wear a seatbelt, and you can safely maneuver your car and apply brakes.  You should see your doctor in the office for a follow-up appointment approximately 2-3 weeks after your surgery.  Make sure that you call  for this appointment within a day or two after you arrive home to insure a convenient appointment time.  WHEN TO CALL YOUR DOCTOR: Fever over 101.0 Inability to urinate Continued bleeding from incision Increased pain, redness, or drainage from the incision Increasing abdominal pain  The clinic staff is available to answer your questions during regular business hours.  Please don't hesitate to call and ask to speak to one of the nurses for clinical concerns.  If you have a medical emergency, go to the nearest emergency room or call 911.  A surgeon from [facility] is always on call for the hospital.  Velora Heckler, MD, [facility] Office: [phone] Toll Free:  [phone] FAX [phone]  Web site: [email or web address]   Increase activity slowly  As directed    Remove dressing in 48 hours  As directed        Medication List         fenofibrate 145 MG tablet  Commonly known as:  TRICOR  Take 145 mg by mouth every morning.     OxyCODONE 20 mg T12a 12 hr tablet  Commonly known as:  OXYCONTIN  Take 1 tablet (20 mg total) by mouth every 12 (twelve) hours.     oxyCODONE-acetaminophen 5-325 MG per tablet  Commonly known as:  PERCOCET/ROXICET  Take 1-2 tablets by mouth every 4 (four) hours as needed.     valACYclovir 1000 MG tablet  Commonly known as:  VALTREX  Take 1 g by mouth daily as needed. Outbreak         Velora Heckler, MD, [facility] Office: [phone]   Signed: Velora Heckler 05/05/2013, 1:03 PM

## 2013-05-05 NOTE — Progress Notes (Signed)
Pt shivering, Dr. Rica Mast notified, order rec'd and med given

## 2013-05-05 NOTE — Anesthesia Postprocedure Evaluation (Signed)
Anesthesia Post Note  Patient: Steve Francis  Procedure(s) Performed: Procedure(s) (LRB): LAPAROSCOPIC CHOLECYSTECTOMY WITH INTRAOPERATIVE CHOLANGIOGRAM (N/A)  Anesthesia type: General  Patient location: PACU  Post pain: Pain level controlled  Post assessment: Post-op Vital signs reviewed  Last Vitals:  Filed Vitals:   05/05/13 1040  BP: 129/71  Pulse: 71  Temp: 36.6 C  Resp: 16    Post vital signs: Reviewed  Level of consciousness: sedated  Complications: No apparent anesthesia complications

## 2013-05-05 NOTE — Op Note (Signed)
Procedure Note  Pre-operative Diagnosis: Calculus of gallbladder with other cholecystitis, without mention of obstruction  Post-operative Diagnosis: Same  Surgeon:  Velora Heckler, MD, FACS  Assistant:  none   Procedure:  Laparoscopic cholecystectomy with intra-operative cholangiography  Anesthesia:  General  Estimated Blood Loss: Minimal  Drains: none         Specimen: Gallbladder to pathology  Indications:  This patient presents with symptomatic gallbladder disease and will undergo laparoscopic cholecystectomy with intraoperative cholangiography.  Procedure Details:  The patient was seen in the pre-op holding area. The risks, benefits, complications, treatment options, and expected outcomes have been discussed with the patient. The patient and/or family agreed with the proposed plan and signed the informed consent form.  The patient was taken to Operating Room, identified as Steve Francis and the procedure verified as Laparoscopic Cholecystectomy with Intraoperative Cholangiogram. A "time out" was completed and the above information confirmed.  Following induction of general anesthesia, the patient was placed in the supine position. The abdomen was prepped in the usual aseptic fashion.  An incision was made in the skin near the umbilicus. The midline fascia was incised and the peritoneal cavity entered and the Hasson canula was introduced under direct vision. Hasson canula was secured with a pursestring 0-Vicryl suture. Pneumoperitoneum was then established with carbon dioxide. Additional trocars were introduced under direct vision along the right costal margin in the midline, mid-clavicular line, and anterior axillary line.  The gallbladder was identified and the fundus grasped and retracted cephalad. Adhesions were taken down bluntly and with the electrocautery as needed, taking care not to injure any adjacent structures. The infundibulum was grasped and retracted laterally,  exposing the peritoneum overlying the triangle of Calot. This was incised and structures exposed in a blunt fashion. The cystic duct was clearly identified and bluntly dissected circumferentially and clipped at the neck of the gallbladder.  An incision was made in the cystic duct and the cholangiogram catheter introduced. The catheter was secured using an ligaclip.  Real-time cholangiography was performed using the C-arm.  There was rapid filling of a normal caliber common bile duct.  There was reflux of contrast into the left and right hepatic ductal systems.  There was free flow distally into the duodenum without filling defect or obstruction.  Catheter was removed from the peritoneal cavity.  The cystic duct was then doubly ligated with surgical clips and divided. The cystic artery was identified, dissected circumferentially, ligated with ligaclips, and divided.  The gallbladder was dissected from the liver bed with the electrocautery used for hemostasis. The gallbladder was completely removed and placed into an endocatch bag. The right upper quadrant was irrigated and inspected. Hemostasis was achieved with the electrocautery. Warm saline irrigation was utilized and was repeatedly aspirated until clear.  Pneumoperitoneum was released after viewing removal of the trocars with good hemostasis noted. The umbilical wound was irrigated and the fascia was then closed with the pursestring suture.  The skin was then closed with 4-0 Monocril subcuticular sutures and steri-strips and dressings were applied.  Instrument, sponge, and needle counts were correct at the conclusion of the case.  The patient tolerated the procedure well.                 Disposition: PACU - hemodynamically stable.   Velora Heckler, MD, [facility] Office: [phone]

## 2013-05-05 NOTE — Transfer of Care (Signed)
Immediate Anesthesia Transfer of Care Note  Patient: Steve Francis  Procedure(s) Performed: Procedure(s): LAPAROSCOPIC CHOLECYSTECTOMY WITH INTRAOPERATIVE CHOLANGIOGRAM (N/A)  Patient Location: PACU  Anesthesia Type:General  Level of Consciousness: sedated  Airway & Oxygen Therapy: Patient Spontanous Breathing and Patient connected to face mask oxygen  Post-op Assessment: Report given to PACU RN and Post -op Vital signs reviewed and stable  Post vital signs: Reviewed and stable  Complications: No apparent anesthesia complications

## 2013-05-05 NOTE — Care Management Note (Signed)
   CARE MANAGEMENT NOTE 05/05/2013  Patient:  Steve Francis, Steve Francis   Account Number:  [id number]  Date Initiated:  05/05/2013  Documentation initiated by:  Tattianna Schnarr  Subjective/Objective Assessment:   42 yo male admitted with ABD pain.     Action/Plan:   Home when stable   Anticipated DC Date:  05/05/2013   Anticipated DC Plan:  HOME/SELF CARE  In-house referral  NA      DC Planning Services  NA      PAC Choice  NA   Choice offered to / List presented to:  NA   DME arranged  NA      DME agency  NA     HH arranged  NA      HH agency  NA   Status of service:  Completed, signed off Medicare Important Message given?   (If response is "NO", the following Medicare IM given date fields will be blank) Date Medicare IM given:   Date Additional Medicare IM given:    Discharge Disposition:    Per UR Regulation:  Reviewed for med. necessity/level of care/duration of stay  If discussed at Long Length of Stay Meetings, dates discussed:    Comments:  05/05/13 1413 Nioka Thorington,RN,MSN [phone] Chart reviewed for utilization of services. No needs identified at this time.

## 2013-05-06 ENCOUNTER — Encounter (HOSPITAL_COMMUNITY): Payer: Self-pay | Admitting: Surgery

## 2013-05-06 NOTE — Telephone Encounter (Signed)
Pt doing well. States states he apologizes for his stress over pain control at time of phone call yesterday. He was basing his post op pain needs on previous back surgery. Pt states he is doing well and pain is controlled well. Voiding, eating and resting well.  Pt given po appt and will call with any concerns.

## 2013-05-13 ENCOUNTER — Encounter (INDEPENDENT_AMBULATORY_CARE_PROVIDER_SITE_OTHER): Payer: 59 | Admitting: Surgery

## 2013-05-18 ENCOUNTER — Encounter (INDEPENDENT_AMBULATORY_CARE_PROVIDER_SITE_OTHER): Payer: Self-pay

## 2013-05-18 ENCOUNTER — Encounter (INDEPENDENT_AMBULATORY_CARE_PROVIDER_SITE_OTHER): Payer: Self-pay | Admitting: Surgery

## 2013-05-18 ENCOUNTER — Ambulatory Visit (INDEPENDENT_AMBULATORY_CARE_PROVIDER_SITE_OTHER): Payer: 59 | Admitting: Surgery

## 2013-05-18 VITALS — BP 106/64 | HR 68 | Resp 16 | Ht 72.0 in | Wt 185.8 lb

## 2013-05-18 DIAGNOSIS — K8010 Calculus of gallbladder with chronic cholecystitis without obstruction: Secondary | ICD-10-CM

## 2013-05-18 NOTE — Progress Notes (Signed)
General Surgery [facility]  Chief Complaint  Patient presents with  . Routine Post Op    lap chole 05/05/2013    HISTORY: Patient is a 42 year old male who underwent laparoscopic cholecystectomy on October 28. Postoperative course has been uneventful. He returns today for wound checks.  EXAM: Abdominal incisions are well-healed. No sign of herniation nor infection. Right upper quadrant is soft and nontender without mass.  IMPRESSION: Chronic cholecystitis, cholelithiasis  PLAN: Patient will begin applying topical creams to his incisions. He is released to full activity without restriction. He will return for surgical care as needed.  Velora Heckler, MD, FACS General & Endocrine Surgery [facility]   Visit Diagnoses: 1. Cholelithiasis with cholecystitis

## 2013-05-18 NOTE — Patient Instructions (Signed)
  COCOA BUTTER & VITAMIN E CREAM  ([employer] or other brand)  Apply cocoa butter/vitamin E cream to your incision 2 - 3 times daily.  Massage cream into incision for one minute with each application.  Use sunscreen (50 SPF or higher) for first 6 months after surgery if area is exposed to sun.  You may substitute Mederma or other scar reducing creams as desired.   

## 2013-05-20 ENCOUNTER — Encounter (INDEPENDENT_AMBULATORY_CARE_PROVIDER_SITE_OTHER): Payer: 59 | Admitting: Surgery

## 2014-06-15 ENCOUNTER — Other Ambulatory Visit: Payer: Self-pay | Admitting: Dermatology

## 2016-05-03 DIAGNOSIS — Z23 Encounter for immunization: Secondary | ICD-10-CM | POA: Diagnosis not present

## 2016-06-04 DIAGNOSIS — Z23 Encounter for immunization: Secondary | ICD-10-CM | POA: Diagnosis not present

## 2016-12-27 DIAGNOSIS — B009 Herpesviral infection, unspecified: Secondary | ICD-10-CM | POA: Diagnosis not present

## 2017-10-14 DIAGNOSIS — J069 Acute upper respiratory infection, unspecified: Secondary | ICD-10-CM | POA: Diagnosis not present

## 2017-10-14 DIAGNOSIS — H6981 Other specified disorders of Eustachian tube, right ear: Secondary | ICD-10-CM | POA: Diagnosis not present

## 2017-10-15 DIAGNOSIS — Z0000 Encounter for general adult medical examination without abnormal findings: Secondary | ICD-10-CM | POA: Diagnosis not present

## 2017-10-15 DIAGNOSIS — E782 Mixed hyperlipidemia: Secondary | ICD-10-CM | POA: Diagnosis not present

## 2018-02-04 ENCOUNTER — Ambulatory Visit
Admission: RE | Admit: 2018-02-04 | Discharge: 2018-02-04 | Disposition: A | Discharge status: Home / Self Care | Payer: BLUE CROSS/BLUE SHIELD | Source: Ambulatory Visit | Attending: Family Medicine | Admitting: Family Medicine

## 2018-02-04 ENCOUNTER — Other Ambulatory Visit: Payer: Self-pay | Admitting: Family Medicine

## 2018-02-04 DIAGNOSIS — S90121A Contusion of right lesser toe(s) without damage to nail, initial encounter: Secondary | ICD-10-CM

## 2018-02-04 DIAGNOSIS — S92424A Nondisplaced fracture of distal phalanx of right great toe, initial encounter for closed fracture: Secondary | ICD-10-CM | POA: Diagnosis not present

## 2018-02-06 DIAGNOSIS — S92404A Nondisplaced unspecified fracture of right great toe, initial encounter for closed fracture: Secondary | ICD-10-CM | POA: Diagnosis not present

## 2018-02-20 DIAGNOSIS — S92404D Nondisplaced unspecified fracture of right great toe, subsequent encounter for fracture with routine healing: Secondary | ICD-10-CM | POA: Diagnosis not present

## 2018-05-11 DIAGNOSIS — Z23 Encounter for immunization: Secondary | ICD-10-CM | POA: Diagnosis not present

## 2018-12-08 DIAGNOSIS — Z0000 Encounter for general adult medical examination without abnormal findings: Secondary | ICD-10-CM | POA: Diagnosis not present

## 2018-12-08 DIAGNOSIS — E782 Mixed hyperlipidemia: Secondary | ICD-10-CM | POA: Diagnosis not present

## 2018-12-08 DIAGNOSIS — E785 Hyperlipidemia, unspecified: Secondary | ICD-10-CM | POA: Diagnosis not present

## 2018-12-09 DIAGNOSIS — Z0000 Encounter for general adult medical examination without abnormal findings: Secondary | ICD-10-CM | POA: Diagnosis not present

## 2019-01-03 IMAGING — CR DG TOE GREAT 2+V*R*
4 series · 4 of 4 positions shown · non-contrast
Comparison: None.

CLINICAL DATA: 47-year-old male status post fall down stairs days
ago with contusion and abnormal gait.

EXAM:
RIGHT GREAT TOE

[x toes ap right]
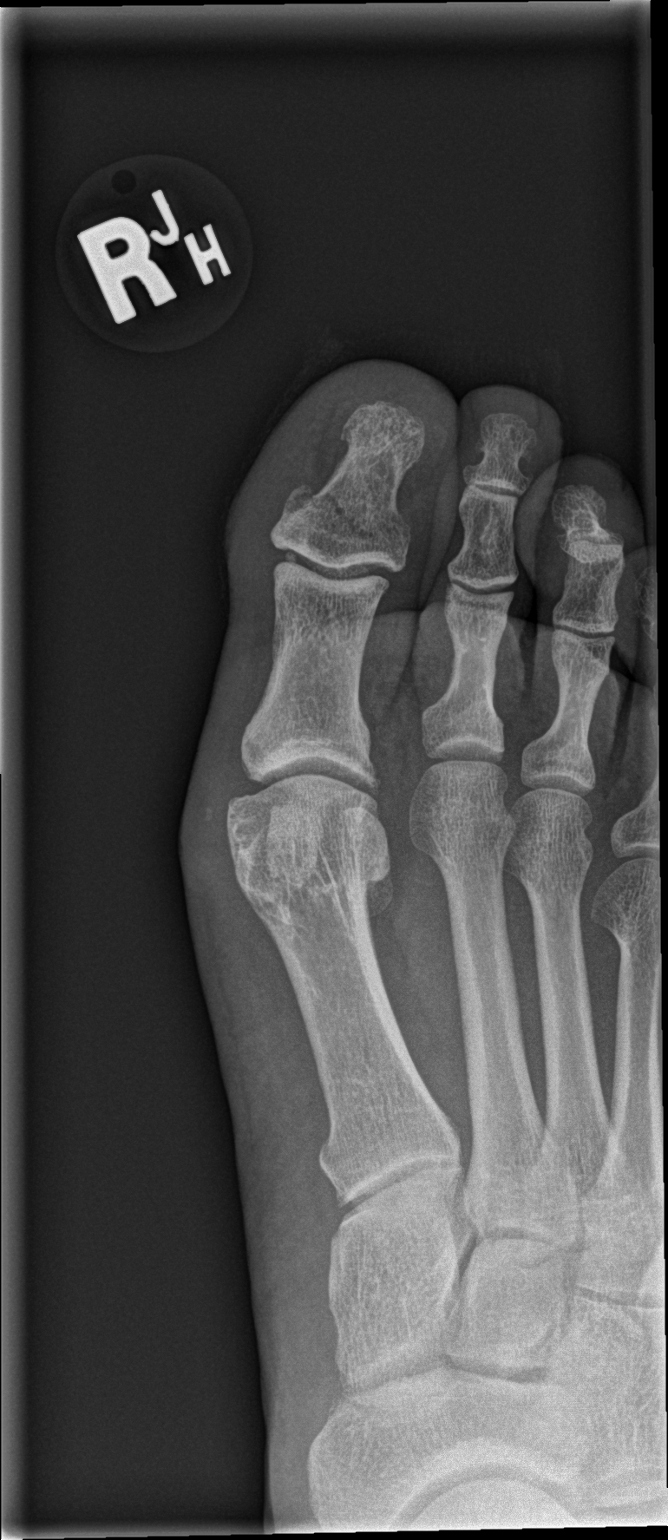

[x toes obl right]
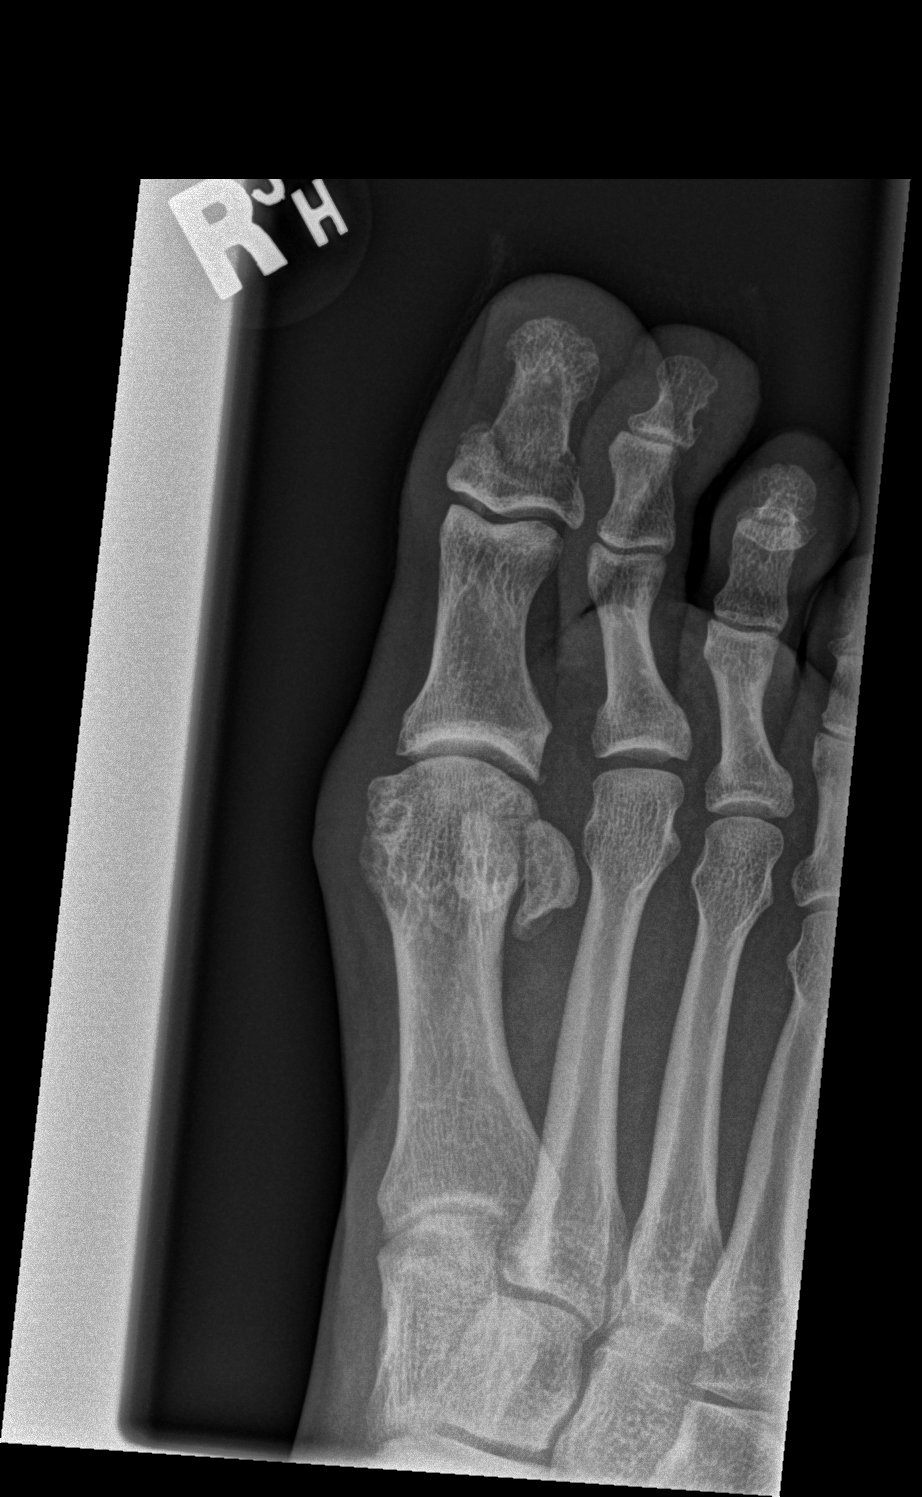

[x toes lat right (1 of 2)]
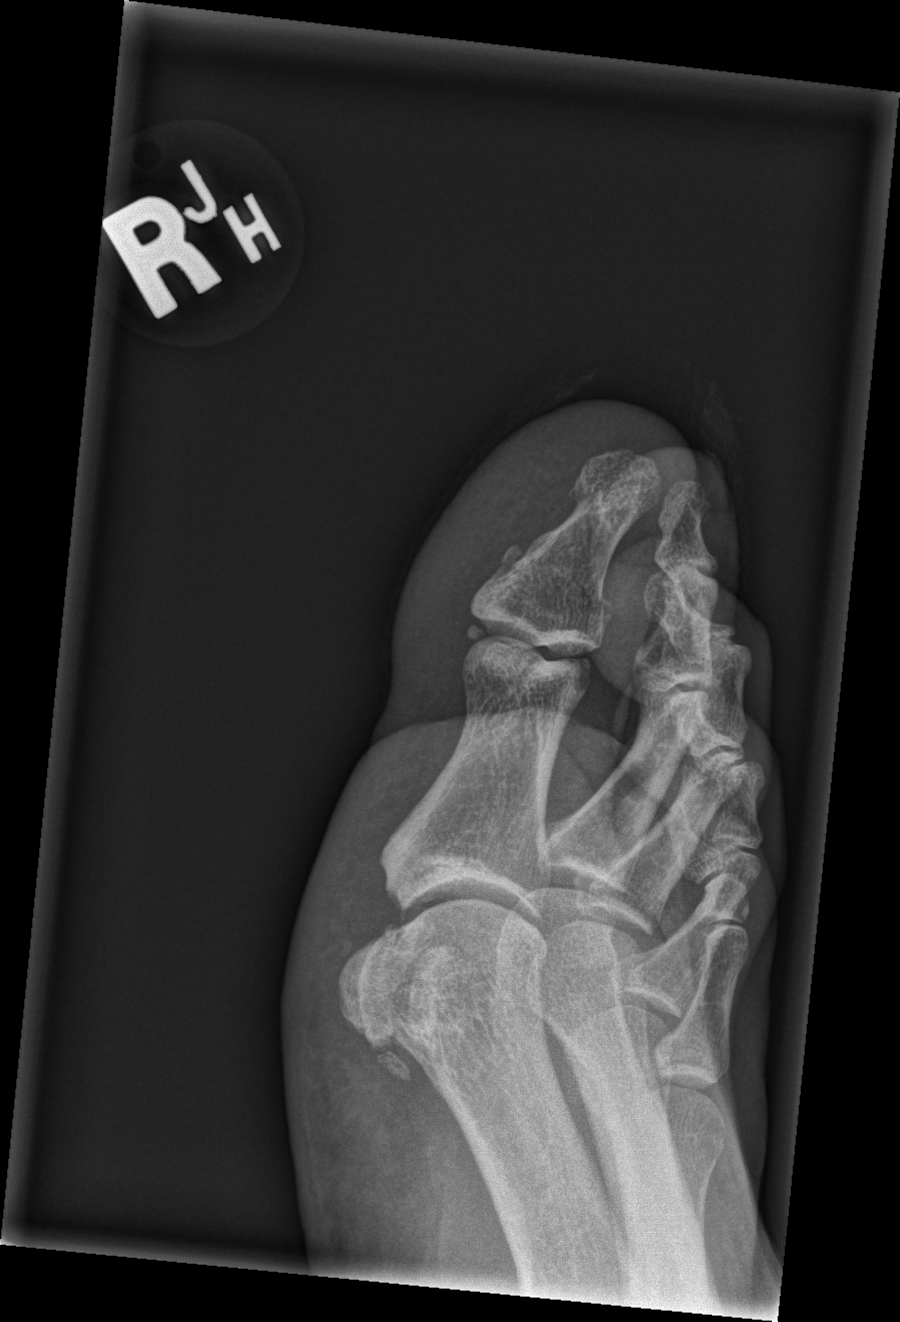

[x toes lat right (2 of 2)]
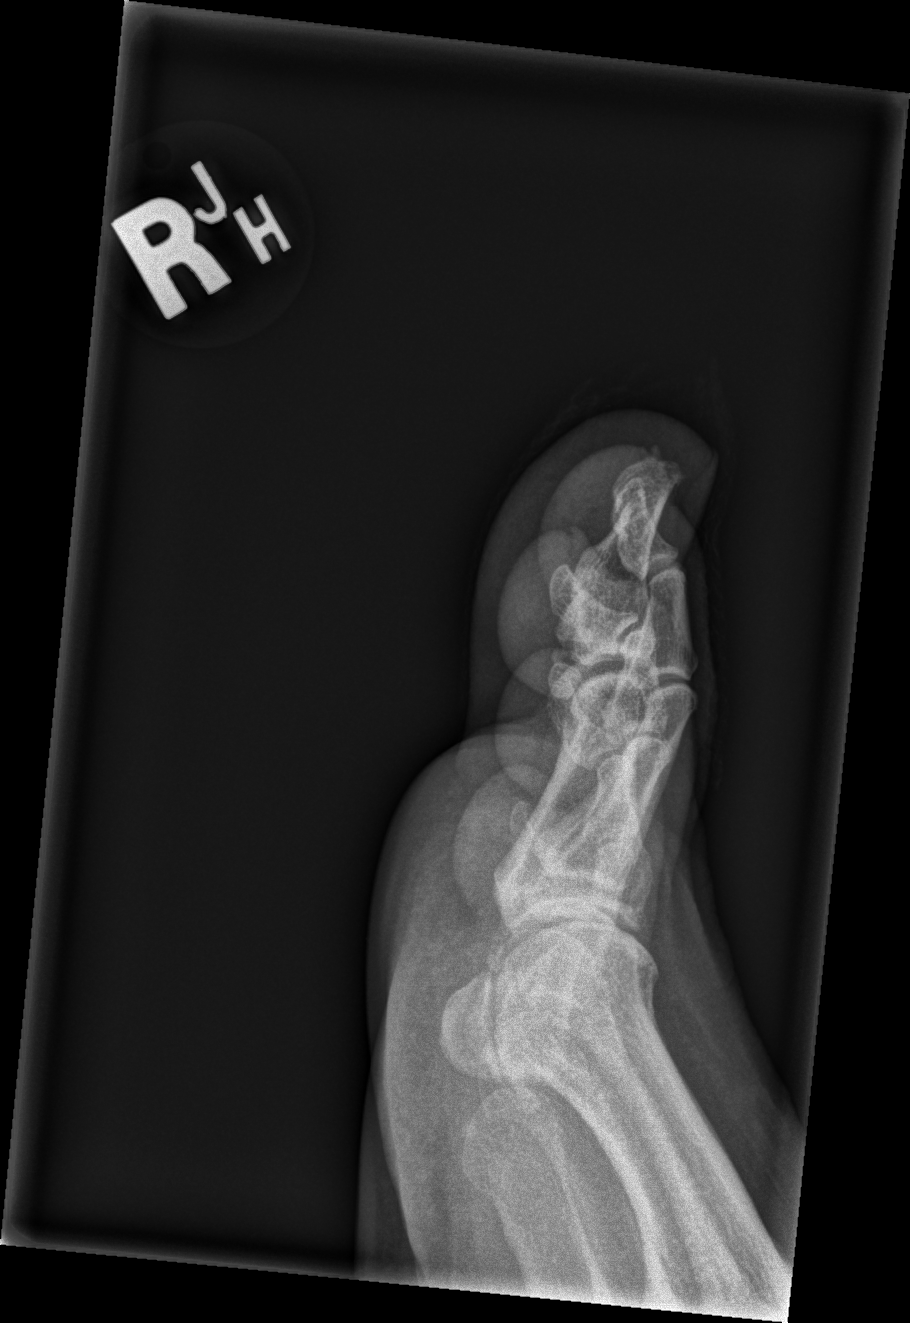

[4 of 4 positions shown; findings below may reference images not displayed]

FINDINGS: Oblique and perhaps mildly comminuted fracture through the proximal
3rd of the right 1st distal phalanx. Extra-articular fracture, the
1st IP joint remains intact. Minimal displacement.

The remaining visible osseous structures appear intact. Mild to
moderate for age 1st MTP osteoarthritis.
IMPRESSION: Oblique and perhaps mildly comminuted but nondisplaced
extra-articular fracture of the right 1st distal phalanx.

## 2019-03-07 DIAGNOSIS — Z20828 Contact with and (suspected) exposure to other viral communicable diseases: Secondary | ICD-10-CM | POA: Diagnosis not present

## 2019-07-24 ENCOUNTER — Ambulatory Visit: Payer: BC Managed Care – PPO | Attending: Internal Medicine

## 2019-07-24 DIAGNOSIS — Z20822 Contact with and (suspected) exposure to covid-19: Secondary | ICD-10-CM

## 2019-07-25 LAB — NOVEL CORONAVIRUS, NAA: SARS-CoV-2, NAA: NOT DETECTED

## 2019-08-24 ENCOUNTER — Ambulatory Visit: Payer: BC Managed Care – PPO | Attending: Internal Medicine

## 2019-08-24 DIAGNOSIS — Z20822 Contact with and (suspected) exposure to covid-19: Secondary | ICD-10-CM | POA: Insufficient documentation

## 2019-08-25 LAB — NOVEL CORONAVIRUS, NAA: SARS-CoV-2, NAA: NOT DETECTED

## 2019-09-04 ENCOUNTER — Ambulatory Visit: Payer: Self-pay

## 2019-09-09 ENCOUNTER — Ambulatory Visit: Payer: Self-pay

## 2019-09-12 ENCOUNTER — Ambulatory Visit: Payer: Self-pay | Attending: Internal Medicine

## 2019-09-12 DIAGNOSIS — Z23 Encounter for immunization: Secondary | ICD-10-CM | POA: Insufficient documentation

## 2019-09-12 NOTE — Progress Notes (Signed)
   Covid-19 Vaccination Clinic  Name:  TESHAUN OLARTE    MRN: [id number] DOB: 1970/11/01  09/12/2019  Mr. Hobbins was observed post Covid-19 immunization for 15 minutes without incident. He was provided with Vaccine Information Sheet and instruction to access the V-Safe system.   Mr. Taras was instructed to call 911 with any severe reactions post vaccine: Marland Kitchen Difficulty breathing  . Swelling of face and throat  . A fast heartbeat  . A bad rash all over body  . Dizziness and weakness   Immunizations Administered    Name Date Dose VIS Date Route   Pfizer COVID-19 Vaccine 09/12/2019 10:06 AM 0.3 mL 06/19/2019 Intramuscular   Manufacturer: [employer], [employer]   Lot: [id number]   NDC: [address]-6

## 2019-10-03 ENCOUNTER — Ambulatory Visit: Payer: Self-pay | Attending: Internal Medicine

## 2019-10-03 DIAGNOSIS — Z23 Encounter for immunization: Secondary | ICD-10-CM

## 2019-10-03 NOTE — Progress Notes (Signed)
   Covid-19 Vaccination Clinic  Name:  KENTARO ALEWINE    MRN: [id number] DOB: Oct 12, 1970  10/03/2019  Mr. Lorusso was observed post Covid-19 immunization for 15 minutes without incident. He was provided with Vaccine Information Sheet and instruction to access the V-Safe system.   Mr. Kilmer was instructed to call 911 with any severe reactions post vaccine: Marland Kitchen Difficulty breathing  . Swelling of face and throat  . A fast heartbeat  . A bad rash all over body  . Dizziness and weakness   Immunizations Administered    Name Date Dose VIS Date Route   Pfizer COVID-19 Vaccine 10/03/2019  1:32 PM 0.3 mL 06/19/2019 Intramuscular   Manufacturer: [employer], [employer]   Lot: [id number]   NDC: [address]-7

## 2019-10-13 ENCOUNTER — Ambulatory Visit: Payer: Self-pay

## 2019-12-15 DIAGNOSIS — Z125 Encounter for screening for malignant neoplasm of prostate: Secondary | ICD-10-CM | POA: Diagnosis not present

## 2019-12-15 DIAGNOSIS — E785 Hyperlipidemia, unspecified: Secondary | ICD-10-CM | POA: Diagnosis not present

## 2019-12-15 DIAGNOSIS — Z23 Encounter for immunization: Secondary | ICD-10-CM | POA: Diagnosis not present

## 2019-12-15 DIAGNOSIS — Z0000 Encounter for general adult medical examination without abnormal findings: Secondary | ICD-10-CM | POA: Diagnosis not present

## 2019-12-31 DIAGNOSIS — M9902 Segmental and somatic dysfunction of thoracic region: Secondary | ICD-10-CM | POA: Diagnosis not present

## 2019-12-31 DIAGNOSIS — M546 Pain in thoracic spine: Secondary | ICD-10-CM | POA: Diagnosis not present

## 2019-12-31 DIAGNOSIS — M9901 Segmental and somatic dysfunction of cervical region: Secondary | ICD-10-CM | POA: Diagnosis not present

## 2019-12-31 DIAGNOSIS — M542 Cervicalgia: Secondary | ICD-10-CM | POA: Diagnosis not present

## 2019-12-31 DIAGNOSIS — Z03818 Encounter for observation for suspected exposure to other biological agents ruled out: Secondary | ICD-10-CM | POA: Diagnosis not present

## 2020-02-29 DIAGNOSIS — Z23 Encounter for immunization: Secondary | ICD-10-CM | POA: Diagnosis not present

## 2020-06-16 DIAGNOSIS — Z1159 Encounter for screening for other viral diseases: Secondary | ICD-10-CM | POA: Diagnosis not present

## 2020-07-15 ENCOUNTER — Other Ambulatory Visit: Payer: Self-pay | Admitting: Family Medicine

## 2020-07-15 ENCOUNTER — Ambulatory Visit
Admission: RE | Admit: 2020-07-15 | Discharge: 2020-07-15 | Disposition: A | Discharge status: Home / Self Care | Payer: BC Managed Care – PPO | Source: Ambulatory Visit | Attending: Family Medicine | Admitting: Family Medicine

## 2020-07-15 DIAGNOSIS — M542 Cervicalgia: Secondary | ICD-10-CM

## 2020-09-29 DIAGNOSIS — R059 Cough, unspecified: Secondary | ICD-10-CM | POA: Diagnosis not present

## 2020-11-03 DIAGNOSIS — R58 Hemorrhage, not elsewhere classified: Secondary | ICD-10-CM | POA: Diagnosis not present

## 2021-02-02 DIAGNOSIS — Z0000 Encounter for general adult medical examination without abnormal findings: Secondary | ICD-10-CM | POA: Diagnosis not present

## 2021-02-02 DIAGNOSIS — E785 Hyperlipidemia, unspecified: Secondary | ICD-10-CM | POA: Diagnosis not present

## 2021-02-02 DIAGNOSIS — Z125 Encounter for screening for malignant neoplasm of prostate: Secondary | ICD-10-CM | POA: Diagnosis not present

## 2021-03-14 DIAGNOSIS — Z0000 Encounter for general adult medical examination without abnormal findings: Secondary | ICD-10-CM | POA: Diagnosis not present
